# Patient Record
Sex: Male | Born: 1935 | Race: White | Hispanic: No | Marital: Married | Smoking: Never smoker
Health system: Southern US, Community
[De-identification: ages and names within clinical notes are randomized; demographics above are authoritative.]

## PROBLEM LIST (undated history)

## (undated) DIAGNOSIS — F419 Anxiety disorder, unspecified: Secondary | ICD-10-CM

## (undated) DIAGNOSIS — E785 Hyperlipidemia, unspecified: Secondary | ICD-10-CM

## (undated) DIAGNOSIS — K219 Gastro-esophageal reflux disease without esophagitis: Secondary | ICD-10-CM

## (undated) HISTORY — PX: COLONOSCOPY: SHX174

## (undated) HISTORY — PX: OTHER SURGICAL HISTORY: SHX169

---

## 2007-10-19 ENCOUNTER — Emergency Department (HOSPITAL_BASED_OUTPATIENT_CLINIC_OR_DEPARTMENT_OTHER): Admission: EM | Admit: 2007-10-19 | Discharge: 2007-10-19 | Payer: Self-pay | Admitting: Emergency Medicine

## 2013-02-24 ENCOUNTER — Emergency Department (HOSPITAL_BASED_OUTPATIENT_CLINIC_OR_DEPARTMENT_OTHER): Payer: Medicare Other

## 2013-02-24 ENCOUNTER — Encounter (HOSPITAL_BASED_OUTPATIENT_CLINIC_OR_DEPARTMENT_OTHER): Payer: Self-pay | Admitting: Emergency Medicine

## 2013-02-24 ENCOUNTER — Emergency Department (HOSPITAL_BASED_OUTPATIENT_CLINIC_OR_DEPARTMENT_OTHER)
Admission: EM | Admit: 2013-02-24 | Discharge: 2013-02-25 | Disposition: A | Discharge status: Home / Self Care | Payer: Medicare Other | Attending: Emergency Medicine | Admitting: Emergency Medicine

## 2013-02-24 DIAGNOSIS — K219 Gastro-esophageal reflux disease without esophagitis: Secondary | ICD-10-CM | POA: Insufficient documentation

## 2013-02-24 DIAGNOSIS — F411 Generalized anxiety disorder: Secondary | ICD-10-CM | POA: Diagnosis not present

## 2013-02-24 DIAGNOSIS — K529 Noninfective gastroenteritis and colitis, unspecified: Secondary | ICD-10-CM

## 2013-02-24 DIAGNOSIS — R5381 Other malaise: Secondary | ICD-10-CM | POA: Insufficient documentation

## 2013-02-24 DIAGNOSIS — Z79899 Other long term (current) drug therapy: Secondary | ICD-10-CM | POA: Insufficient documentation

## 2013-02-24 DIAGNOSIS — R1084 Generalized abdominal pain: Secondary | ICD-10-CM | POA: Diagnosis present

## 2013-02-24 DIAGNOSIS — K5289 Other specified noninfective gastroenteritis and colitis: Secondary | ICD-10-CM | POA: Diagnosis not present

## 2013-02-24 DIAGNOSIS — E785 Hyperlipidemia, unspecified: Secondary | ICD-10-CM | POA: Insufficient documentation

## 2013-02-24 DIAGNOSIS — R911 Solitary pulmonary nodule: Secondary | ICD-10-CM

## 2013-02-24 DIAGNOSIS — R509 Fever, unspecified: Secondary | ICD-10-CM | POA: Insufficient documentation

## 2013-02-24 DIAGNOSIS — Z792 Long term (current) use of antibiotics: Secondary | ICD-10-CM | POA: Insufficient documentation

## 2013-02-24 HISTORY — DX: Hyperlipidemia, unspecified: E78.5

## 2013-02-24 HISTORY — DX: Gastro-esophageal reflux disease without esophagitis: K21.9

## 2013-02-24 HISTORY — DX: Anxiety disorder, unspecified: F41.9

## 2013-02-24 LAB — CBC WITH DIFFERENTIAL/PLATELET
Basophils Absolute: 0 K/uL (ref 0.0–0.1)
Basophils Relative: 0 % (ref 0–1)
Eosinophils Absolute: 0.4 10*3/uL (ref 0.0–0.7)
Eosinophils Relative: 3 % (ref 0–5)
HCT: 44.2 % (ref 39.0–52.0)
Hemoglobin: 14.9 g/dL (ref 13.0–17.0)
Lymphocytes Relative: 26 % (ref 12–46)
Lymphs Abs: 3.8 10*3/uL (ref 0.7–4.0)
MCH: 30.8 pg (ref 26.0–34.0)
MCHC: 33.7 g/dL (ref 30.0–36.0)
MCV: 91.3 fL (ref 78.0–100.0)
Monocytes Absolute: 1.5 10*3/uL — ABNORMAL HIGH (ref 0.1–1.0)
Monocytes Relative: 10 % (ref 3–12)
Neutro Abs: 9 10*3/uL — ABNORMAL HIGH (ref 1.7–7.7)
Neutrophils Relative %: 61 % (ref 43–77)
Platelets: 259 K/uL (ref 150–400)
RBC: 4.84 MIL/uL (ref 4.22–5.81)
RDW: 14.1 % (ref 11.5–15.5)
WBC: 14.7 10*3/uL — ABNORMAL HIGH (ref 4.0–10.5)

## 2013-02-24 LAB — CG4 I-STAT (LACTIC ACID): Lactic Acid, Venous: 1.5 mmol/L (ref 0.5–2.2)

## 2013-02-24 LAB — OCCULT BLOOD X 1 CARD TO LAB, STOOL: Fecal Occult Bld: POSITIVE — AB

## 2013-02-24 MED ORDER — FENTANYL CITRATE 0.05 MG/ML IJ SOLN
50.0000 ug | Freq: Once | INTRAMUSCULAR | Status: AC
  Administered 2013-02-25: 50 ug via INTRAVENOUS
  Filled 2013-02-24: qty 2

## 2013-02-24 MED ORDER — SODIUM CHLORIDE 0.9 % IV SOLN
Freq: Once | INTRAVENOUS | Status: AC
  Administered 2013-02-24: via INTRAVENOUS

## 2013-02-24 MED ORDER — ONDANSETRON HCL 4 MG/2ML IJ SOLN
4.0000 mg | Freq: Once | INTRAMUSCULAR | Status: AC
  Administered 2013-02-25: 4 mg via INTRAVENOUS
  Filled 2013-02-24: qty 2

## 2013-02-24 MED ORDER — PANTOPRAZOLE SODIUM 40 MG IV SOLR
40.0000 mg | Freq: Once | INTRAVENOUS | Status: AC
  Administered 2013-02-25: 40 mg via INTRAVENOUS
  Filled 2013-02-24: qty 40

## 2013-02-24 NOTE — ED Provider Notes (Signed)
CSN: [id number]     Arrival date & time 02/24/13  2010 History  This chart was scribed for Dean Ruiz. Oletta Lamas, MD by Danella Maiers, ED Scribe. This patient was seen in room MH05/MH05 and the patient's care was started at 10:17 PM.   Chief Complaint  Patient presents with  . Abdominal Pain   The history is provided by the patient. No language interpreter was used.   HPI Comments: Dean Ruiz is a 77 y.o. male who presents to the Emergency Department complaining of intermittent diarrhea and abdominal pain since having a colonoscopy 01/09/13 that has become worse and more frequent in the last week with fevers. It was his third colonoscopy by [facility] in [facility] They said he had inflammation of the colon and put him on lialda. He started running a fever and had watery diarrhea, so he went back to the doctor and they switched him to Cipro and Flagyl. He continued having diarrhea and they switched his medications multiple times without relief so last week they took him off all medications and put him on a probiotic. He went again yesterday and they put him on cipro . He denies anything abnormal in the first two colonoscopies. He denies sore throat, cough, rhinorrhea. He has diffuse abd pain, feels bloated all the time, very weak and fatigued.  He had chills today and had tylenol this afternoon.   Past Medical History  Diagnosis Date  . Acid reflux   . Hyperlipemia   . Anxiety    Past Surgical History  Procedure Laterality Date  . Colonoscopy     History reviewed. No pertinent family history. History  Substance Use Topics  . Smoking status: Never Smoker   . Smokeless tobacco: Not on file  . Alcohol Use: No    Review of Systems  Constitutional: Positive for fever, chills and fatigue.  HENT: Negative for rhinorrhea and sinus pressure.   Respiratory: Negative for cough.   Gastrointestinal: Positive for abdominal pain, diarrhea and abdominal distention. Negative for nausea and  vomiting.  Skin: Negative for rash.  Neurological: Positive for weakness.  All other systems reviewed and are negative.    Allergies  Review of patient's allergies indicates no known allergies.  Home Medications   Current Outpatient Rx  Name  Route  Sig  Dispense  Refill  . ciprofloxacin (CIPRO) 500 MG tablet   Oral   Take 500 mg by mouth 2 (two) times daily.         Marland Kitchen LORazepam (ATIVAN) 1 MG tablet   Oral   Take 1 mg by mouth every 8 (eight) hours.         . Multiple Vitamins-Minerals (MULTIVITAMIN WITH MINERALS) tablet   Oral   Take 1 tablet by mouth daily.         . RABEprazole (ACIPHEX) 20 MG tablet   Oral   Take 20 mg by mouth daily.         . rosuvastatin (CRESTOR) 10 MG tablet   Oral   Take 10 mg by mouth daily.          BP 144/84  Pulse 98  Temp(Src) 99.5 F (37.5 C) (Oral)  Resp 20  Ht 5\' 10"  (1.778 m)  Wt 194 lb (87.998 kg)  BMI 27.84 kg/m2  SpO2 98% Physical Exam  Nursing note and vitals reviewed. Constitutional: He is oriented to person, place, and time. He appears well-developed and well-nourished. No distress.  HENT:  Head: Normocephalic and atraumatic.  Eyes: EOM are normal.  Neck: Neck supple. No tracheal deviation present.  Cardiovascular: Normal rate, regular rhythm and intact distal pulses.   Pulmonary/Chest: Effort normal. No respiratory distress.  Abdominal: Soft. Bowel sounds are increased. There is generalized tenderness. There is no rebound.  Hyperactive abdominal sounds. Mild tenderness diffusely.  Musculoskeletal: Normal range of motion.  Neurological: He is alert and oriented to person, place, and time. He exhibits normal muscle tone. Coordination and gait normal.  Skin: Skin is warm and dry. No rash noted. He is not diaphoretic.  Psychiatric: He has a normal mood and affect. His behavior is normal.    ED Course  Procedures (including critical care time) Medications  0.9 %  sodium chloride infusion (not administered)     DIAGNOSTIC STUDIES: Oxygen Saturation is 98% on RA, normal by my interpretation.    COORDINATION OF CARE: 10:29 PM- Discussed treatment plan with pt which includes CT abdomen, blood work, stool sample, UA. Pt agrees to plan.    Labs Review Labs Reviewed  CBC WITH DIFFERENTIAL  COMPREHENSIVE METABOLIC PANEL  LIPASE, BLOOD  URINALYSIS, ROUTINE W REFLEX MICROSCOPIC  OCCULT BLOOD X 1 CARD TO LAB, STOOL  GI PATHOGEN PANEL BY PCR, STOOL   Imaging Review No results found.  EKG Interpretation   None       MDM   1. Suspected Colitis      I personally performed the services described in this documentation, which was scribed in my presence. The recorded information has been reviewed and considered.  Pt with subacute diarrhea, fevers without unusual travel history.  No reported blood, just very waterry.  Pt has been tried on abx, probiotics now, and lialda initially for colitis.  Pt is distended with diffuse tenderness, likely low grade fevers.  C diff is possible, will send for GI pathogens.  Will get CT scan.  IVF's, IV analgesics and zofran prn.  Pt is not septic appearing at this time.  No h/o atrial fib.  No CP, back pain.  PCP is with [facility].  Will sign out to Dr. Nicanor Alcon to follow up on labs, CT scan.  Would recommend stopping Cipro and consider treatment with just Flagyl.    Dean Ruiz. Houda Brau, MD 02/25/13 1513

## 2013-02-24 NOTE — ED Notes (Signed)
Pt reports colonoscopy Sept 9 and has had intermittent diarrhea and abd pain since then pt in ED tonight because diarrhea has not stopped for the past one week and has seen by PMD 5 time since Sept 9th.

## 2013-02-25 DIAGNOSIS — K5289 Other specified noninfective gastroenteritis and colitis: Secondary | ICD-10-CM | POA: Diagnosis not present

## 2013-02-25 LAB — URINALYSIS, ROUTINE W REFLEX MICROSCOPIC
Bilirubin Urine: NEGATIVE
Glucose, UA: NEGATIVE mg/dL
Hgb urine dipstick: NEGATIVE
Ketones, ur: NEGATIVE mg/dL
Leukocytes, UA: NEGATIVE
Nitrite: NEGATIVE
Protein, ur: NEGATIVE mg/dL
Specific Gravity, Urine: 1.005 (ref 1.005–1.030)
Urobilinogen, UA: 0.2 mg/dL (ref 0.0–1.0)
pH: 6 (ref 5.0–8.0)

## 2013-02-25 LAB — COMPREHENSIVE METABOLIC PANEL WITH GFR
ALT: 14 U/L (ref 0–53)
CO2: 29 meq/L (ref 19–32)
Chloride: 99 meq/L (ref 96–112)
GFR calc non Af Amer: 81 mL/min — ABNORMAL LOW (ref >90)
Sodium: 139 meq/L (ref 135–145)
Total Bilirubin: 0.6 mg/dL (ref 0.3–1.2)

## 2013-02-25 LAB — COMPREHENSIVE METABOLIC PANEL
AST: 9 U/L (ref 0–37)
Albumin: 4.2 g/dL (ref 3.5–5.2)
Alkaline Phosphatase: 64 U/L (ref 39–117)
BUN: 10 mg/dL (ref 6–23)
Calcium: 10.4 mg/dL (ref 8.4–10.5)
Creatinine, Ser: 0.9 mg/dL (ref 0.50–1.35)
GFR calc Af Amer: >90 mL/min (ref >90)
Glucose, Bld: 106 mg/dL — ABNORMAL HIGH (ref 70–99)
Potassium: 3.8 mEq/L (ref 3.5–5.1)
Total Protein: 8.5 g/dL — ABNORMAL HIGH (ref 6.0–8.3)

## 2013-02-25 LAB — LIPASE, BLOOD: Lipase: 18 U/L (ref 11–59)

## 2013-02-25 MED ORDER — CULTURELLE DIGESTIVE HEALTH PO CAPS: 1.0000 | ORAL_CAPSULE | Freq: Three times a day (TID) | ORAL | Status: AC

## 2013-02-25 MED ORDER — IOHEXOL 300 MG/ML  SOLN
50.0000 mL | Freq: Once | INTRAMUSCULAR | Status: AC | PRN
  Administered 2013-02-24: 50 mL via ORAL

## 2013-02-25 MED ORDER — METRONIDAZOLE 500 MG PO TABS: 500.0000 mg | ORAL_TABLET | Freq: Three times a day (TID) | ORAL | Status: AC

## 2013-02-25 MED ORDER — IOHEXOL 300 MG/ML  SOLN
100.0000 mL | Freq: Once | INTRAMUSCULAR | Status: AC | PRN
  Administered 2013-02-25: 100 mL via INTRAVENOUS

## 2013-02-25 MED ORDER — HYDROCODONE-ACETAMINOPHEN 5-325 MG PO TABS: 1.0000 | ORAL_TABLET | Freq: Four times a day (QID) | ORAL | Status: AC | PRN

## 2013-02-25 NOTE — ED Notes (Signed)
Report given to Bobby, RN.  

## 2013-02-25 NOTE — Discharge Instructions (Signed)
Colitis °Colitis is inflammation of the colon. Colitis can be a short-term or long-standing (chronic) illness. Crohn's disease and ulcerative colitis are 2 types of colitis which are chronic. They usually require lifelong treatment. °CAUSES  °There are many different causes of colitis, including: °· Viruses. °· Germs (bacteria). °· Medicine reactions. °SYMPTOMS  °· Diarrhea. °· Intestinal bleeding. °· Pain. °· Fever. °· Throwing up (vomiting). °· Tiredness (fatigue). °· Weight loss. °· Bowel blockage. °DIAGNOSIS  °The diagnosis of colitis is based on examination and stool or blood tests. X-rays, CT scan, and colonoscopy may also be needed. °TREATMENT  °Treatment may include: °· Fluids given through the vein (intravenously). °· Bowel rest (nothing to eat or drink for a period of time). °· Medicine for pain and diarrhea. °· Medicines (antibiotics) that kill germs. °· Cortisone medicines. °· Surgery. °HOME CARE INSTRUCTIONS  °· Get plenty of rest. °· Drink enough water and fluids to keep your urine clear or pale yellow. °· Eat a well-balanced diet. °· Call your caregiver for follow-up as recommended. °SEEK IMMEDIATE MEDICAL CARE IF:  °· You develop chills. °· You have an oral temperature above 102° F (38.9° C), not controlled by medicine. °· You have extreme weakness, fainting, or dehydration. °· You have repeated vomiting. °· You develop severe belly (abdominal) pain or are passing bloody or tarry stools. °MAKE SURE YOU:  °· Understand these instructions. °· Will watch your condition. °· Will get help right away if you are not doing well or get worse. °Document Released: 05/27/2004 Document Revised: 07/12/2011 Document Reviewed: 08/22/2009 °ExitCare® Patient Information ©2014 [employer]. ° °

## 2013-02-25 NOTE — ED Notes (Signed)
Pt aware that we need a urine specimen.  Urinal at bedside.

## 2013-02-26 LAB — GI PATHOGEN PANEL BY PCR, STOOL
C difficile toxin A/B: POSITIVE
Campylobacter by PCR: NEGATIVE
Cryptosporidium by PCR: NEGATIVE
E coli (ETEC) LT/ST: NEGATIVE
E coli (STEC): NEGATIVE
E coli 0157 by PCR: NEGATIVE
G lamblia by PCR: NEGATIVE
Norovirus GI/GII: NEGATIVE
Rotavirus A by PCR: NEGATIVE
Salmonella by PCR: NEGATIVE
Shigella by PCR: NEGATIVE

## 2015-01-09 IMAGING — CT CT ABD-PELV W/ CM
2 of 5 series · 15 of 46 positions shown, 17 images · IV contrast (omnipaque)
Comparison: None.

CLINICAL DATA: Diarrhea and abdominal pain and.  Fever.

EXAM:
CT ABDOMEN AND PELVIS WITH CONTRAST
TECHNIQUE: Multidetector CT imaging of the abdomen and pelvis was performed
using the standard protocol following bolus administration of
intravenous contrast.
CONTRAST:  50mL OMNIPAQUE IOHEXOL 300 MG/ML SOLN, 100mL OMNIPAQUE
IOHEXOL 300 MG/ML SOLN

[Series 2: abd/pelvis 5.0 b31f · axial · 0.85mm/px · z∈[+595,+1040]mm · 12 of 101 slices shown, 14 images]
[im 6/101  soft-tissue]
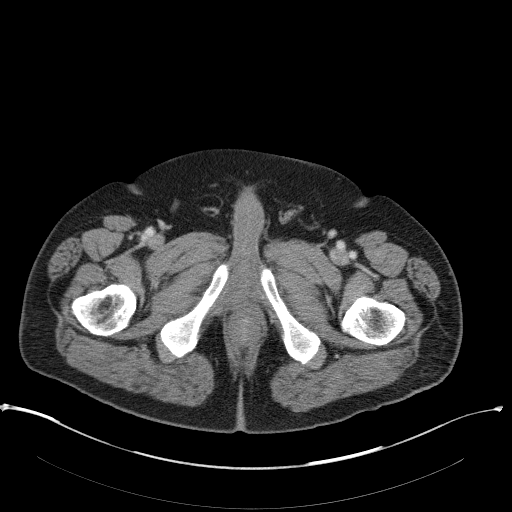
[im 6/101  bone]
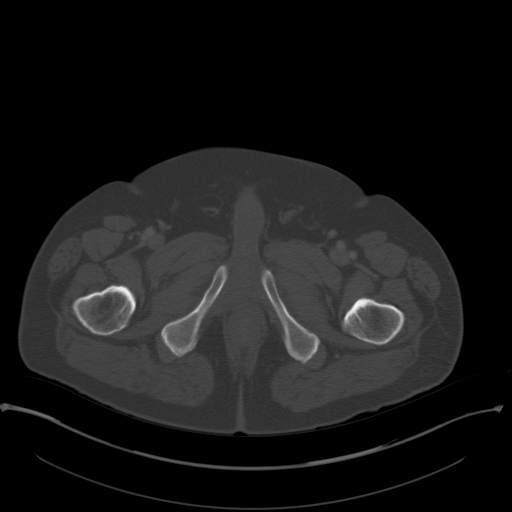
[im 16/101  soft-tissue]
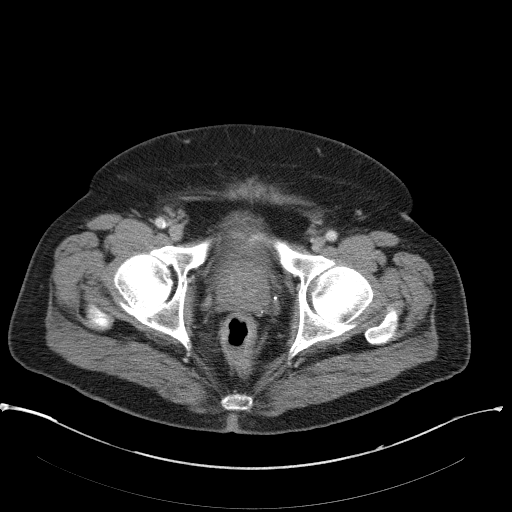
[im 22/101  soft-tissue]
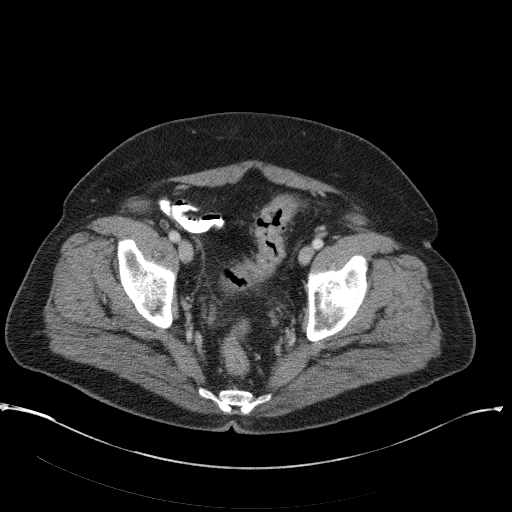
[im 32/101  soft-tissue]
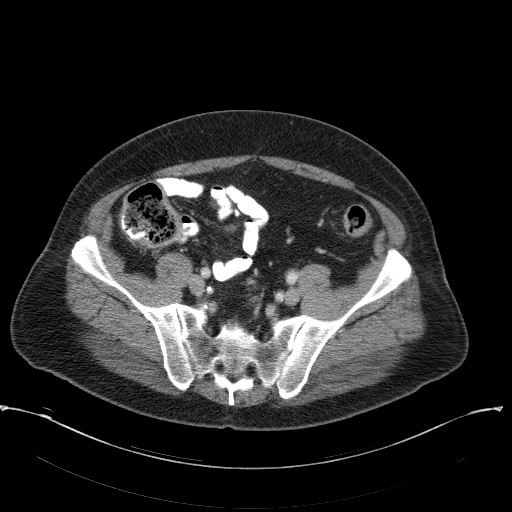
[im 37/101  soft-tissue]
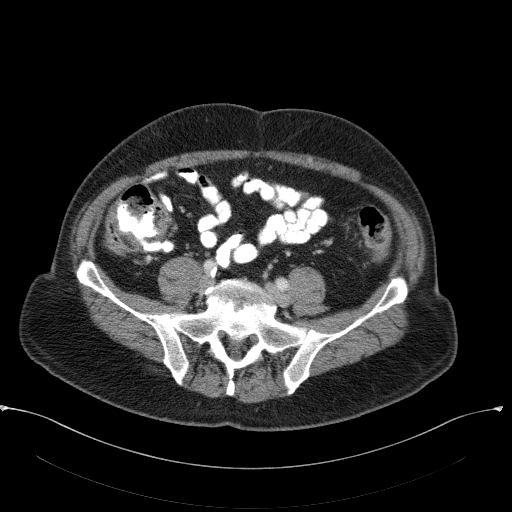
[im 48/101  soft-tissue]
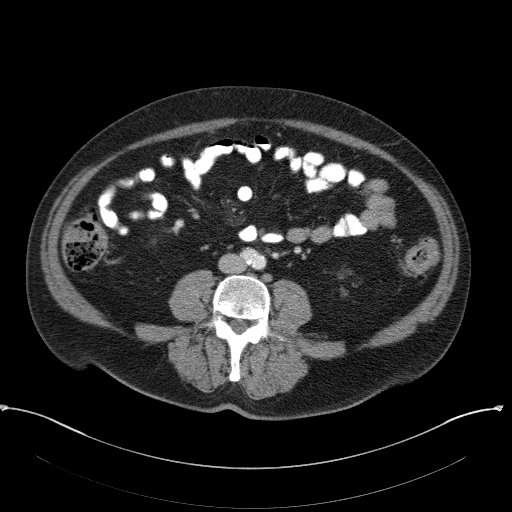
[im 53/101  soft-tissue]
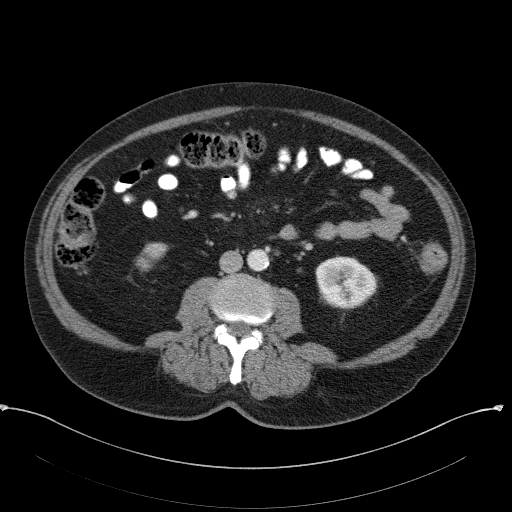
[im 64/101  soft-tissue]
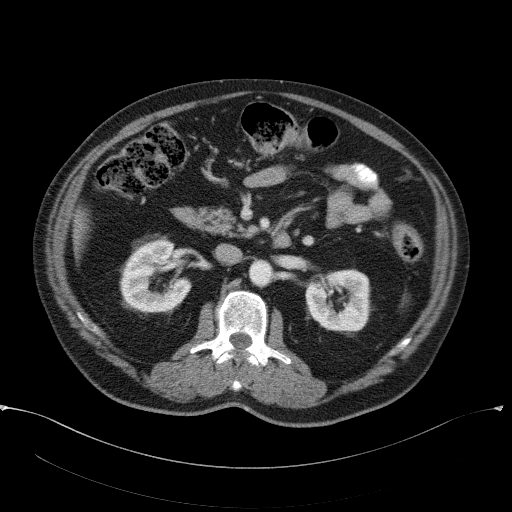
[im 69/101  soft-tissue]
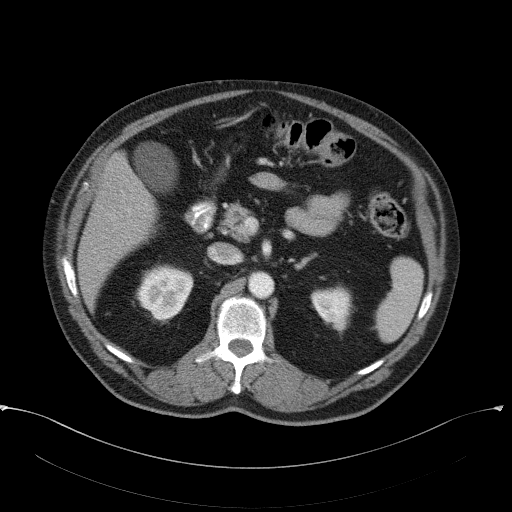
[im 69/101  bone]
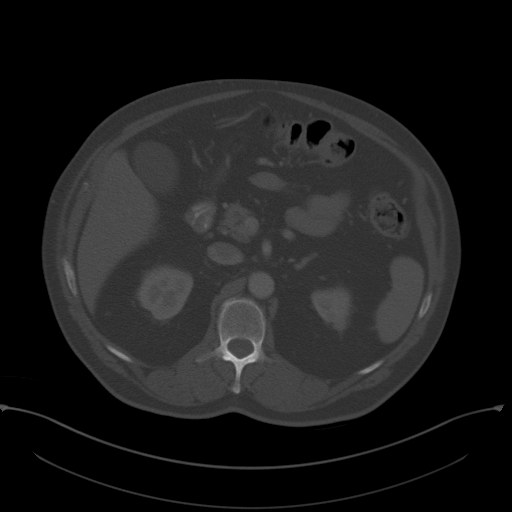
[im 79/101  soft-tissue]
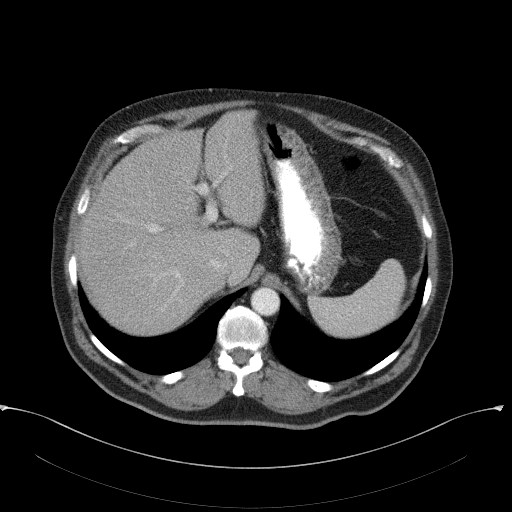
[im 85/101  soft-tissue]
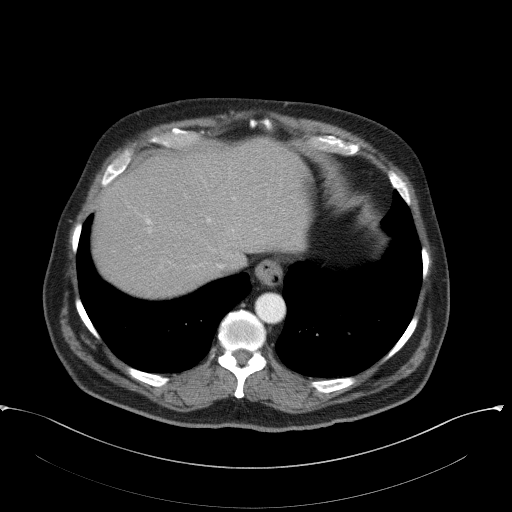
[im 95/101  soft-tissue]
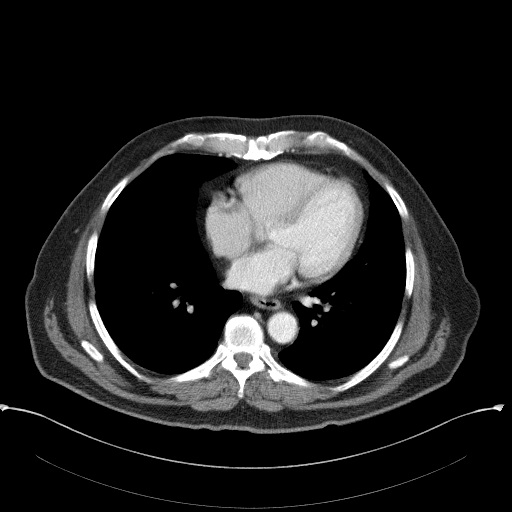

[Series 5: abd/pelvis 3.0 coronal · coronal · 0.95mm/px · 3 of 92 slices shown]
[im 31/92  soft-tissue]
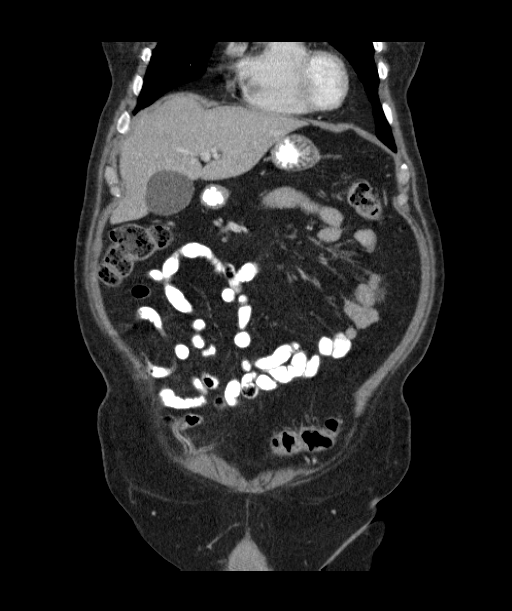
[im 41/92  soft-tissue]
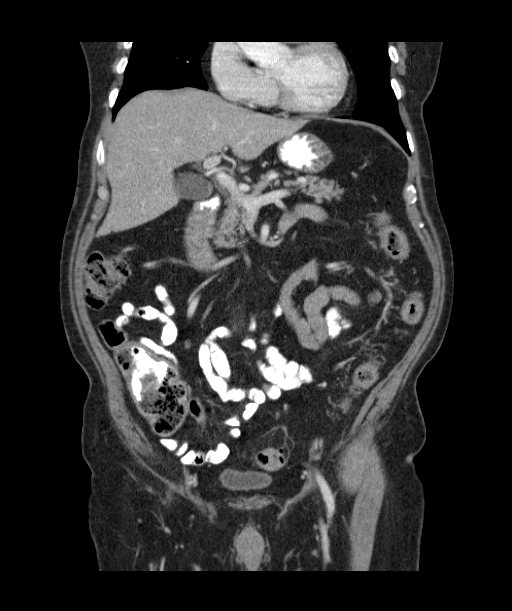
[im 51/92  soft-tissue]
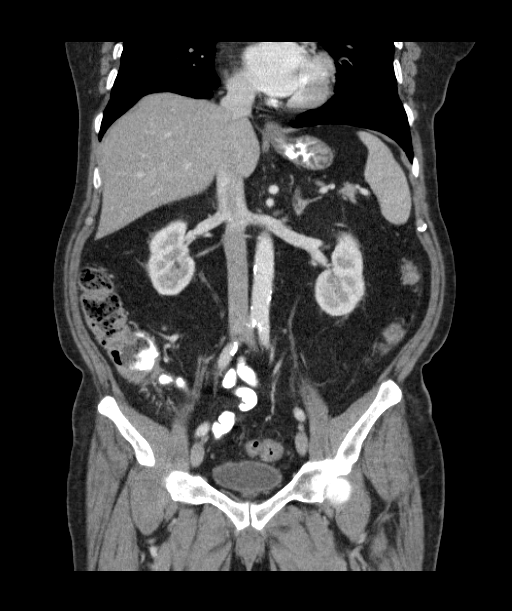

[15 of 46 positions shown; findings below may reference images not displayed]

FINDINGS: Lung Bases: Bilateral lower lobe 4 mm pulmonary nodules are per
(image 4 ,image 10)

Liver:  Normal.

Spleen:  Normal.

Gallbladder:  Distended. No calcified stones.

Common bile duct:  Normal.

Pancreas:  Normal.

Adrenal glands:  Normal bilaterally.

Kidneys: Normal enhancement. Nonspecific bilateral perinephric
stranding is probably just age related. Normal excretion of contrast
from both kidneys. 1 cm simple renal cyst Is present in the left
kidney. The left ureter appears normal. Right ureter appears normal.

Stomach:  Small hiatal hernia.  Otherwise normal.

Small bowel: No inflammatory changes of small bowel or obstruction.
This D mesentery is present, nonspecific. Ileocecal valve appears
normal.

Colon: There are no right lower quadrant inflammatory changes. The
appendix is not identified. Ascending colon and transverse colon
demonstrate prominent stool. The descending colon and sigmoid
coiling show mild mural thickening which is probably due to
underdistention. Similar changes are present within the rectum
although there is some fluid in the rectum suggesting proctitis. The
sigmoid colon demonstrates prominent fat around the sigmoid
suggesting creeping fat which can be associated with inflammatory
bowel disease.

Pelvic Genitourinary: Normal appearance of the urinary bladder.
Prostate enlargement to 4.9 cm transverse.

Bones:  No aggressive osseous lesions. Lumbar spondylosis.

Vasculature: Atherosclerosis. No aneurysm or acute vascular
abnormality.

Body Wall: Fat containing right inguinal hernia.
IMPRESSION: 1. 4 mm and smaller peripheral pulmonary nodules in the lower lobes.
If the patient is at high risk for bronchogenic carcinoma, follow-up
chest CT at 9year is recommended. If the patient is at low risk, no
follow-up is needed. This recommendation follows the consensus
statement: Guidelines for Management of Small Pulmonary Nodules
Detected on CT Scans: A Statement from the [HOSPITAL] as
published in Radiology 0880; [DATE].
2. Small amount of fluid in the rectum is abnormal but nonspecific
and can be associated with proctitis. Suggestion of creeping fat
along the sigmoid can be associated with inflammatory bowel disease.
Descending colon and sigmoid colon or decompressed which may account
for mural thickening. Mild colitis can have a similar appearance.

## 2019-10-07 ENCOUNTER — Encounter (HOSPITAL_BASED_OUTPATIENT_CLINIC_OR_DEPARTMENT_OTHER): Payer: Self-pay | Admitting: Emergency Medicine

## 2019-10-07 ENCOUNTER — Emergency Department (HOSPITAL_BASED_OUTPATIENT_CLINIC_OR_DEPARTMENT_OTHER): Payer: Medicare Other

## 2019-10-07 ENCOUNTER — Other Ambulatory Visit: Payer: Self-pay

## 2019-10-07 ENCOUNTER — Emergency Department (HOSPITAL_BASED_OUTPATIENT_CLINIC_OR_DEPARTMENT_OTHER)
Admission: EM | Admit: 2019-10-07 | Discharge: 2019-10-07 | Disposition: A | Discharge status: Home / Self Care | Payer: Medicare Other | Attending: Emergency Medicine | Admitting: Emergency Medicine

## 2019-10-07 DIAGNOSIS — R300 Dysuria: Secondary | ICD-10-CM | POA: Diagnosis not present

## 2019-10-07 DIAGNOSIS — Z96651 Presence of right artificial knee joint: Secondary | ICD-10-CM | POA: Insufficient documentation

## 2019-10-07 DIAGNOSIS — K5900 Constipation, unspecified: Secondary | ICD-10-CM | POA: Diagnosis not present

## 2019-10-07 DIAGNOSIS — Z79899 Other long term (current) drug therapy: Secondary | ICD-10-CM | POA: Diagnosis not present

## 2019-10-07 DIAGNOSIS — R1030 Lower abdominal pain, unspecified: Secondary | ICD-10-CM | POA: Diagnosis present

## 2019-10-07 LAB — COMPREHENSIVE METABOLIC PANEL
ALT: 30 U/L (ref 0–44)
AST: 15 U/L (ref 15–41)
Albumin: 3.4 g/dL — ABNORMAL LOW (ref 3.5–5.0)
Alkaline Phosphatase: 49 U/L (ref 38–126)
Anion gap: 10 (ref 5–15)
BUN: 19 mg/dL (ref 8–23)
CO2: 24 mmol/L (ref 22–32)
Calcium: 8.8 mg/dL — ABNORMAL LOW (ref 8.9–10.3)
Chloride: 99 mmol/L (ref 98–111)
Creatinine, Ser: 0.83 mg/dL (ref 0.61–1.24)
GFR calc Af Amer: >60 mL/min (ref >60)
GFR calc non Af Amer: >60 mL/min (ref >60)
Glucose, Bld: 122 mg/dL — ABNORMAL HIGH (ref 70–99)
Potassium: 3.6 mmol/L (ref 3.5–5.1)
Sodium: 133 mmol/L — ABNORMAL LOW (ref 135–145)
Total Bilirubin: 1 mg/dL (ref 0.3–1.2)
Total Protein: 7.5 g/dL (ref 6.5–8.1)

## 2019-10-07 LAB — CBC WITH DIFFERENTIAL/PLATELET
Abs Immature Granulocytes: 0.1 10*3/uL — ABNORMAL HIGH (ref 0.00–0.07)
Basophils Absolute: 0.1 10*3/uL (ref 0.0–0.1)
Basophils Relative: 0 %
Eosinophils Absolute: 0.1 10*3/uL (ref 0.0–0.5)
Eosinophils Relative: 0 %
HCT: 38.2 % — ABNORMAL LOW (ref 39.0–52.0)
Hemoglobin: 12.7 g/dL — ABNORMAL LOW (ref 13.0–17.0)
Immature Granulocytes: 1 %
Lymphocytes Relative: 7 %
Lymphs Abs: 1.4 10*3/uL (ref 0.7–4.0)
MCH: 31 pg (ref 26.0–34.0)
MCHC: 33.2 g/dL (ref 30.0–36.0)
MCV: 93.2 fL (ref 80.0–100.0)
Monocytes Absolute: 2.1 10*3/uL — ABNORMAL HIGH (ref 0.1–1.0)
Monocytes Relative: 12 %
Neutro Abs: 14.6 10*3/uL — ABNORMAL HIGH (ref 1.7–7.7)
Neutrophils Relative %: 80 %
Platelets: 377 10*3/uL (ref 150–400)
RBC: 4.1 MIL/uL — ABNORMAL LOW (ref 4.22–5.81)
RDW: 13.4 % (ref 11.5–15.5)
WBC: 18.3 10*3/uL — ABNORMAL HIGH (ref 4.0–10.5)
nRBC: 0 % (ref 0.0–0.2)

## 2019-10-07 LAB — URINALYSIS, ROUTINE W REFLEX MICROSCOPIC
Bilirubin Urine: NEGATIVE
Glucose, UA: NEGATIVE mg/dL
Ketones, ur: 15 mg/dL — AB
Leukocytes,Ua: NEGATIVE
Nitrite: NEGATIVE
Protein, ur: NEGATIVE mg/dL
Specific Gravity, Urine: 1.01 (ref 1.005–1.030)
pH: 6 (ref 5.0–8.0)

## 2019-10-07 LAB — URINALYSIS, MICROSCOPIC (REFLEX): Squamous Epithelial / HPF: NONE SEEN (ref 0–5)

## 2019-10-07 MED ORDER — CEPHALEXIN 500 MG PO CAPS
500.0000 mg | ORAL_CAPSULE | Freq: Two times a day (BID) | ORAL | 0 refills | Status: AC
Start: 2019-10-07 — End: ?

## 2019-10-07 MED ORDER — FLEET ENEMA 7-19 GM/118ML RE ENEM
1.0000 | ENEMA | Freq: Once | RECTAL | Status: AC
  Administered 2019-10-07: 1 via RECTAL
  Filled 2019-10-07: qty 1

## 2019-10-07 MED ORDER — CEPHALEXIN 250 MG PO CAPS
500.0000 mg | ORAL_CAPSULE | Freq: Once | ORAL | Status: AC
  Administered 2019-10-07: 500 mg via ORAL
  Filled 2019-10-07: qty 2

## 2019-10-07 MED ORDER — IOHEXOL 300 MG/ML  SOLN
100.0000 mL | Freq: Once | INTRAMUSCULAR | Status: AC | PRN
  Administered 2019-10-07: 100 mL via INTRAVENOUS

## 2019-10-07 MED ORDER — SODIUM CHLORIDE 0.9 % IV BOLUS
500.0000 mL | Freq: Once | INTRAVENOUS | Status: AC
  Administered 2019-10-07: 500 mL via INTRAVENOUS

## 2019-10-07 NOTE — Discharge Instructions (Signed)
Start taking MiraLAX for constipation.  Try to start weaning off the pain medicine.  Return to the emergency room if you have worsening symptoms including abdominal pain, vomiting, fevers, difficulty urinating, increased redness or swelling to your knee, or other worsening symptoms.

## 2019-10-07 NOTE — ED Notes (Signed)
3 scans on bladder done showed , , .

## 2019-10-07 NOTE — ED Provider Notes (Signed)
[facility] EMERGENCY DEPARTMENT Provider Note   CSN: [id number] Arrival date & time: 10/07/19  1643     History Chief Complaint  Patient presents with  . Abdominal Pain  . Constipation    Dean Ruiz is a 84 y.o. male.  Patient is a 84 year old male who presents with abdominal pain and constipation.  He had a recent right total knee replacement.  This was done on May 27.  He was discharged on May 28.  He says since that time he is only had 1 small bowel movement.  He has pain across his lower abdomen.  He also has difficulty getting his urine out and some pain on urination.  He denies any nausea or vomiting.  No known fevers although his wife said he was having some low-grade fevers at home.  No cough or chest congestion.  He had some mild history with constipation in the past but nothing significant.  He has been taking Percocet for pain control for his knee.  He said his knee seems to be doing well and is healing up well.  He supposed to see his orthopedist tomorrow to have his dressing taken off.        Past Medical History:  Diagnosis Date  . Acid reflux   . Anxiety   . Hyperlipemia     There are no problems to display for this patient.   Past Surgical History:  Procedure Laterality Date  . COLONOSCOPY    . right knee surgery         No family history on file.  Social History   Tobacco Use  . Smoking status: Never Smoker  Substance Use Topics  . Alcohol use: No  . Drug use: No    Home Medications Prior to Admission medications   Medication Sig Start Date End Date Taking? Authorizing Provider  omeprazole (PRILOSEC) 40 MG capsule Take 40 mg by mouth daily.   Yes [provider]  rosuvastatin (CRESTOR) 10 MG tablet Take 10 mg by mouth daily.   Yes [provider]  cephALEXin (KEFLEX) 500 MG capsule Take 1 capsule (500 mg total) by mouth 2 (two) times daily. 10/07/19   [name], MD  HYDROcodone-acetaminophen (NORCO) 5-325  MG per tablet Take 1 tablet by mouth every 6 (six) hours as needed for pain. 02/25/13   [name], April, MD  Lactobacillus-Inulin ([place]) CAPS Take 1 capsule by mouth 3 (three) times daily. 02/25/13   [name], April, MD  LORazepam (ATIVAN) 1 MG tablet Take 1 mg by mouth every 8 (eight) hours.    [provider]  metroNIDAZOLE (FLAGYL) 500 MG tablet Take 1 tablet (500 mg total) by mouth 3 (three) times daily. 02/25/13   [name], April, MD  Multiple Vitamins-Minerals (MULTIVITAMIN WITH MINERALS) tablet Take 1 tablet by mouth daily.    [provider]  RABEprazole (ACIPHEX) 20 MG tablet Take 20 mg by mouth daily.    [provider]    Allergies    Patient has no known allergies.  Review of Systems   Review of Systems  Constitutional: Negative for chills, diaphoresis, fatigue and fever.  HENT: Negative for congestion, rhinorrhea and sneezing.   Eyes: Negative.   Respiratory: Negative for cough, chest tightness and shortness of breath.   Cardiovascular: Negative for chest pain and leg swelling.  Gastrointestinal: Positive for abdominal pain. Negative for blood in stool, diarrhea, nausea and vomiting.  Genitourinary: Positive for difficulty urinating and dysuria. Negative for flank pain,  frequency and hematuria.  Musculoskeletal: Negative for arthralgias and back pain.  Skin: Negative for rash.  Neurological: Negative for dizziness, speech difficulty, weakness, numbness and headaches.    Physical Exam Updated Vital Signs BP (!) 144/62   Pulse 97   Temp 98.4 F (36.9 C) (Oral)   Resp 20   Ht 5\' 9"  (1.753 m)   Wt 85.3 kg   SpO2 93%   BMI 27.76 kg/m   Physical Exam Constitutional:      Appearance: He is well-developed.  HENT:     Head: Normocephalic and atraumatic.  Eyes:     Pupils: Pupils are equal, round, and reactive to light.  Cardiovascular:     Rate and Rhythm: Normal rate and regular rhythm.     Heart sounds: Normal heart  sounds.  Pulmonary:     Effort: Pulmonary effort is normal. No respiratory distress.     Breath sounds: Normal breath sounds. No wheezing or rales.  Chest:     Chest wall: No tenderness.  Abdominal:     General: Bowel sounds are normal.     Palpations: Abdomen is soft.     Tenderness: There is abdominal tenderness in the suprapubic area. There is no guarding or rebound.  Genitourinary:    Comments: There is some stool in the rectal vault but no impaction Musculoskeletal:        General: Normal range of motion.     Cervical back: Normal range of motion and neck supple.     Comments: He has essential dressing on his anterior right knee.  There is some mild swelling that appears consistent with postoperative swelling and some mild warmth and erythema.  Lymphadenopathy:     Cervical: No cervical adenopathy.  Skin:    General: Skin is warm and dry.     Findings: No rash.  Neurological:     Mental Status: He is alert and oriented to person, place, and time.     ED Results / Procedures / Treatments   Labs (all labs ordered are listed, but only abnormal results are displayed) Labs Reviewed  COMPREHENSIVE METABOLIC PANEL - Abnormal; Notable for the following components:      Result Value   Sodium 133 (*)    Glucose, Bld 122 (*)    Calcium 8.8 (*)    Albumin 3.4 (*)    All other components within normal limits  CBC WITH DIFFERENTIAL/PLATELET - Abnormal; Notable for the following components:   WBC 18.3 (*)    RBC 4.10 (*)    Hemoglobin 12.7 (*)    HCT 38.2 (*)    Neutro Abs 14.6 (*)    Monocytes Absolute 2.1 (*)    Abs Immature Granulocytes 0.10 (*)    All other components within normal limits  URINALYSIS, ROUTINE W REFLEX MICROSCOPIC - Abnormal; Notable for the following components:   Color, Urine ORANGE (*)    Hgb urine dipstick LARGE (*)    Ketones, ur 15 (*)    All other components within normal limits  URINALYSIS, MICROSCOPIC (REFLEX) - Abnormal; Notable for the following  components:   Bacteria, UA RARE (*)    All other components within normal limits  URINE CULTURE    EKG None  Radiology CT Abdomen Pelvis W Contrast  Result Date: 10/07/2019 CLINICAL DATA:  Abdominal pain x2 days. EXAM: CT ABDOMEN AND PELVIS WITH CONTRAST TECHNIQUE: Multidetector CT imaging of the abdomen and pelvis was performed using the standard protocol following bolus administration of intravenous contrast.  CONTRAST:  OMNIPAQUE IOHEXOL 300 MG/ML  SOLN COMPARISON:  February 25, 2013 FINDINGS: Lower chest: A stable 4 mm noncalcified lung nodule is seen within the posterolateral aspect of the right lung base. Hepatobiliary: No focal liver abnormality is seen. No gallstones, gallbladder wall thickening, or biliary dilatation. Pancreas: Unremarkable. No pancreatic ductal dilatation or surrounding inflammatory changes. Spleen: Normal in size without focal abnormality. Adrenals/Urinary Tract: Adrenal glands are unremarkable. Kidneys are normal, without renal calculi or hydronephrosis. A 1.5 cm simple cyst is seen within the lateral aspect of the mid left kidney. Bladder is unremarkable. Stomach/Bowel: There is a small hiatal hernia. The appendix is not identified. No evidence of bowel dilatation. Mild thickening of the mid to distal descending colon is seen with a mild amount of pericolonic inflammatory fat stranding. Vascular/Lymphatic: There is moderate severity calcification of the abdominal aorta and bilateral common iliac arteries. No enlarged abdominal or pelvic lymph nodes. Reproductive: There is mild to moderate severity prostate gland enlargement. Other: No abdominal wall hernia or abnormality. No abdominopelvic ascites. Musculoskeletal: Multilevel degenerative changes are seen throughout the lumbar spine. IMPRESSION: 1. Mild thickening of the mid to distal descending colon with a mild amount of pericolonic inflammatory fat stranding, consistent with mild colitis. 2. Small hiatal hernia. 3.  Stable 4 mm noncalcified lung nodule within the posterolateral aspect of the right lung base. 4. Small left renal cyst. 5. Mild to moderate severity prostate gland enlargement. 6. Aortic atherosclerosis. Aortic Atherosclerosis (ICD10-I70.0). Electronically Signed   By: Aram Candela M.D.   On: 10/07/2019 19:04    Procedures Procedures (including critical care time)  Medications Ordered in ED Medications  cephALEXin (KEFLEX) capsule 500 mg (has no administration in time range)  iohexol (OMNIPAQUE) 300 MG/ML solution 100 mL (100 mLs Intravenous Contrast Given 10/07/19 1834)  sodium phosphate (FLEET) 7-19 GM/118ML enema 1 enema (1 enema Rectal Given 10/07/19 2029)  sodium chloride 0.9 % bolus 500 mL (500 mLs Intravenous New Bag/Given 10/07/19 2131)    ED Course  I have reviewed the triage vital signs and the nursing notes.  Pertinent labs & imaging results that were available during my care of the patient were reviewed by me and considered in my medical decision making (see chart for details).    MDM Rules/Calculators/A&P                      Patient is a 84 year old male who presents with abdominal pain and constipation.  He had some pain across his lower abdomen.  Given that, a CT scan was performed which shows some small area of colitis but otherwise unremarkable.  I really do not think that he has infectious colitis.  He does not have any diarrhea or other symptoms that would be more suggestive of that.  He did have some difficulty urinating.  His urine however does not look overwhelming for infection.  It was sent for culture.  His WBC count is elevated.  He has not had a fever here.  His initial temperature was document 100 however 4 minutes later it was documented 98.7.  A rectal temp was obtained which was afebrile.  His wife said he did have a temperature of 100 earlier today.  His knee shows some mild erythema.  I suspect it is more postop changes.  However given the urinary symptoms as  well as some questionable increased redness around his knee, I will start him on Keflex.  His wife says his knee looks pretty much  the same but may be a little bit of increased redness.  He has an appointment with his orthopedist on Thursday.  He had an enema which gave him good results and he had a large bowel movement after this.  He felt much better and had no further abdominal pain on exam.  He has had no vomiting or signs of obstruction.  He has been able to urinate without signs of urinary obstruction.  His urine was sent for culture.  His other labs are nonconcerning.  He was discharged home in good condition.  He was started on Keflex and advised to follow-up with his PCP and orthopedist.  Return precautions were given.  I did advise the wife to have him start taking MiraLAX and to try to wean him off his Percocet. Final Clinical Impression(s) / ED Diagnoses Final diagnoses:  Constipation, unspecified constipation type  Dysuria    Rx / DC Orders ED Discharge Orders         Ordered    cephALEXin (KEFLEX) 500 MG capsule  2 times daily     10/07/19 2232           Rolan Bucco, MD 10/07/19 2240

## 2019-10-07 NOTE — ED Triage Notes (Signed)
Brought by ems from home for c/o lower abdominal cramping, constipation, urinary incontinence.   Reports last bm a week ago.  Had right knee surgery on 5/27.  Has a had a low grade temp max 100 per spouse.

## 2019-10-08 LAB — URINE CULTURE: Culture: NO GROWTH

## 2021-08-20 IMAGING — CT CT ABD-PELV W/ CM
2 of 5 series · 16 of 46 positions shown, 18 images · IV contrast (Omnipaque)
Comparison: February 25, 2013

CLINICAL DATA: Abdominal pain x2 days.

EXAM:
CT ABDOMEN AND PELVIS WITH CONTRAST
TECHNIQUE: Multidetector CT imaging of the abdomen and pelvis was performed
using the standard protocol following bolus administration of
intravenous contrast.
CONTRAST:  100mL OMNIPAQUE IOHEXOL 300 MG/ML  SOLN

[Series 2: axial st · axial · 0.95mm/px · z∈[-526,-42]mm · 13 of 109 slices shown, 15 images]
[im 6/109  soft-tissue]
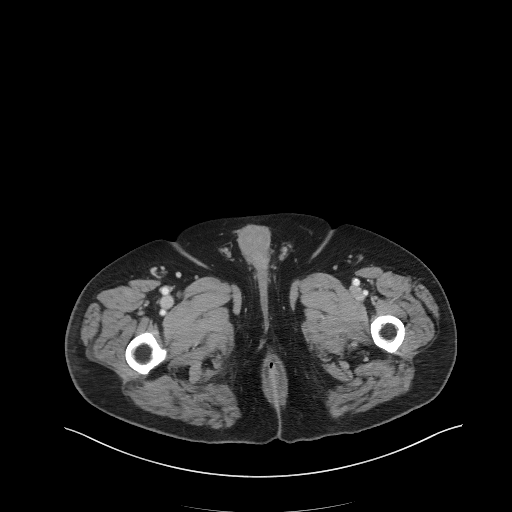
[im 6/109  bone]
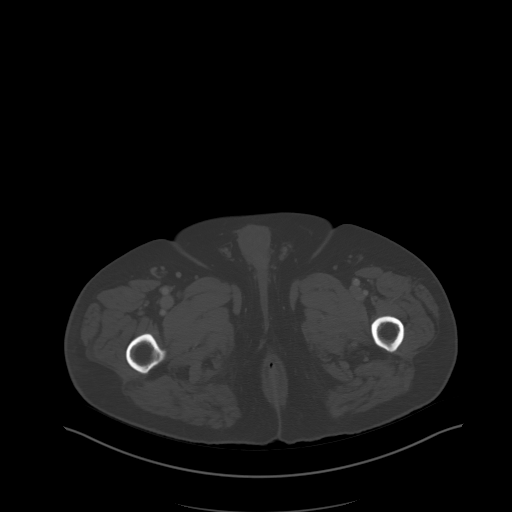
[im 18/109  soft-tissue]
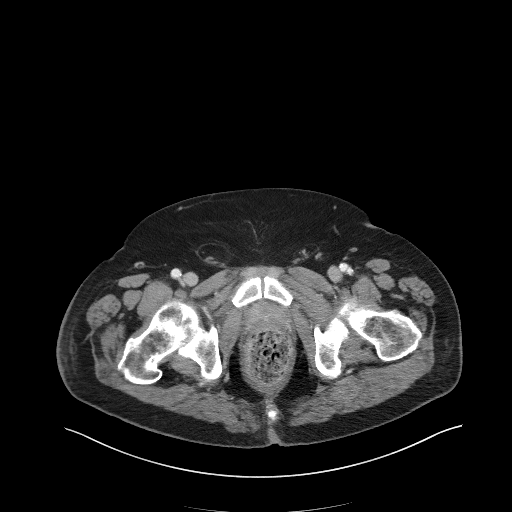
[im 23/109  soft-tissue]
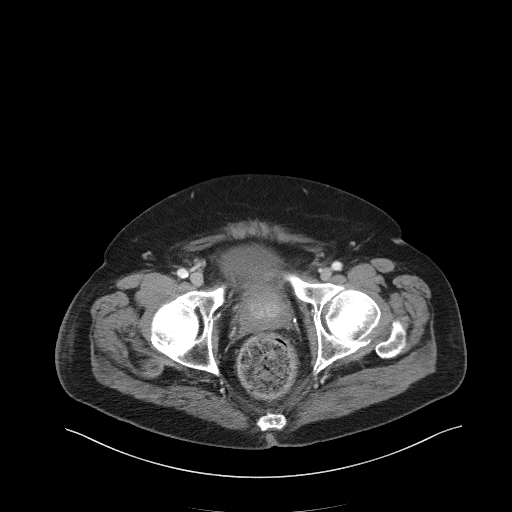
[im 29/109  soft-tissue]
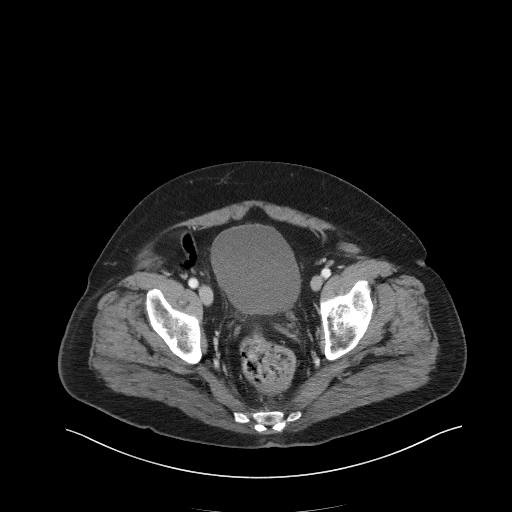
[im 40/109  soft-tissue]
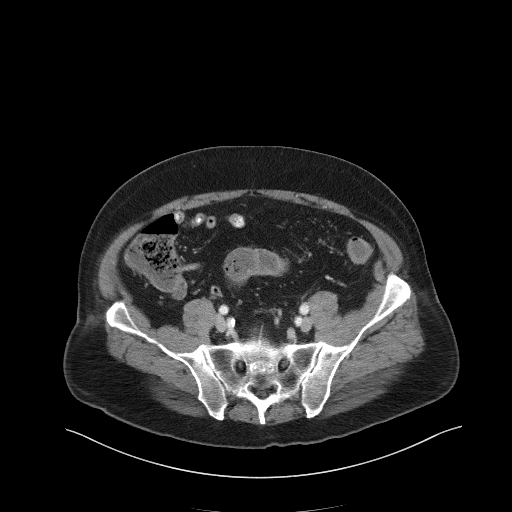
[im 46/109  soft-tissue]
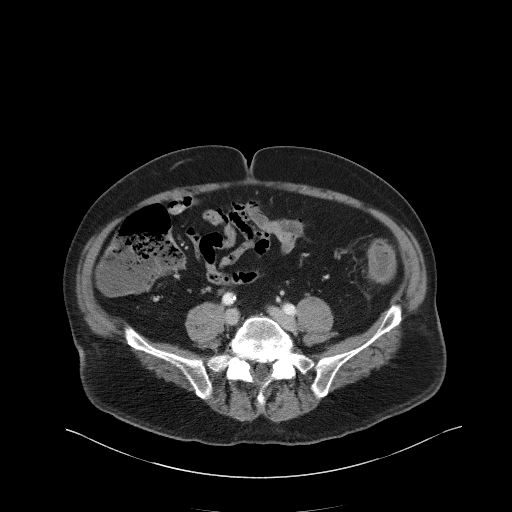
[im 57/109  soft-tissue]
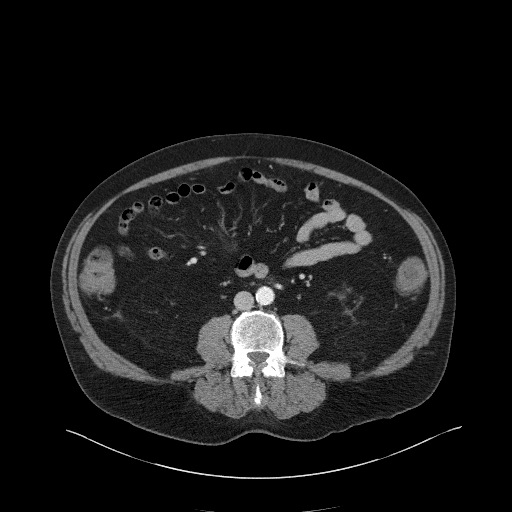
[im 63/109  soft-tissue]
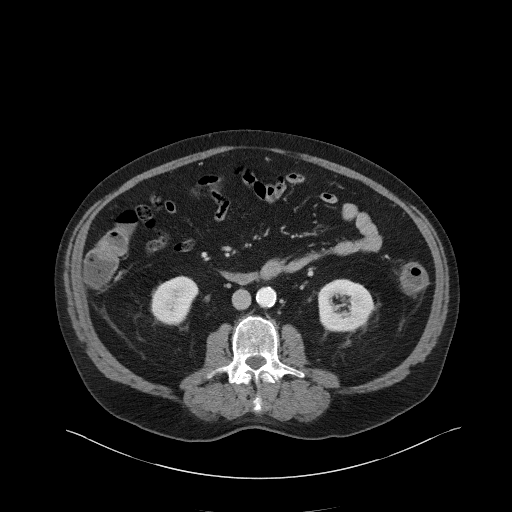
[im 69/109  soft-tissue]
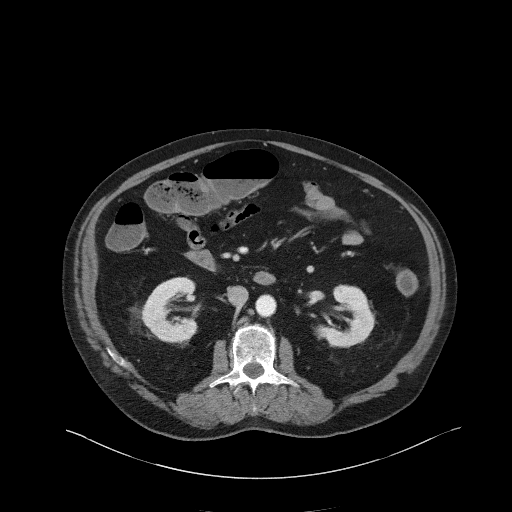
[im 69/109  bone]
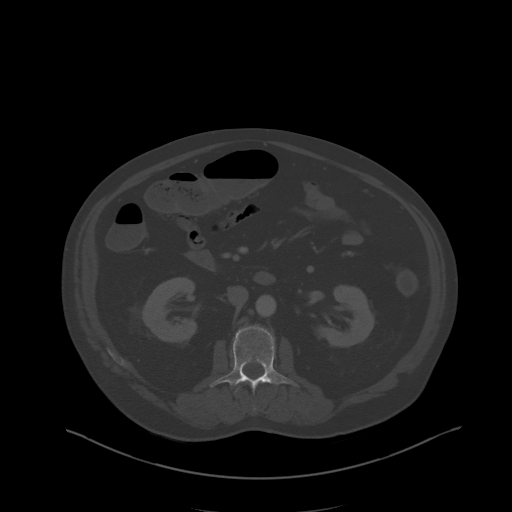
[im 80/109  soft-tissue]
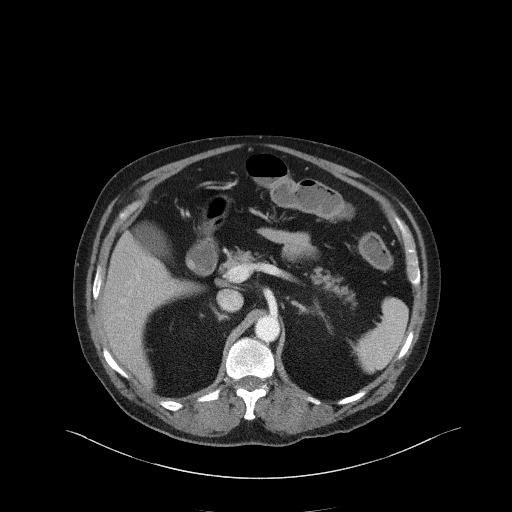
[im 86/109  soft-tissue]
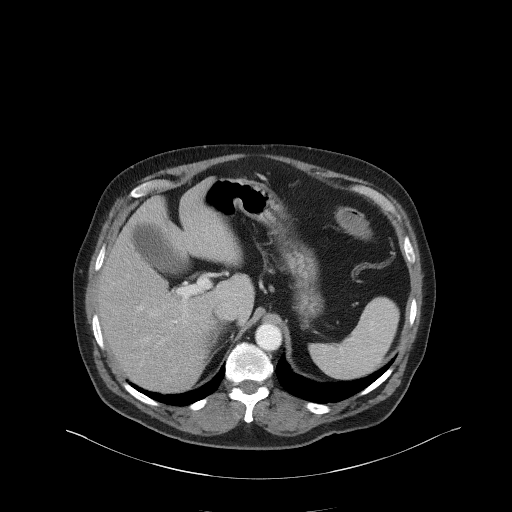
[im 91/109  soft-tissue]
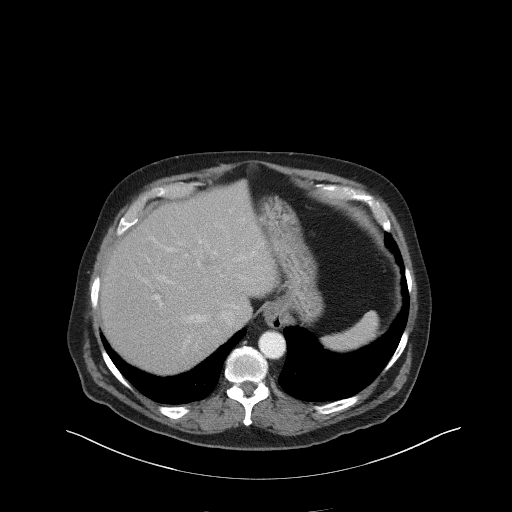
[im 103/109  soft-tissue]
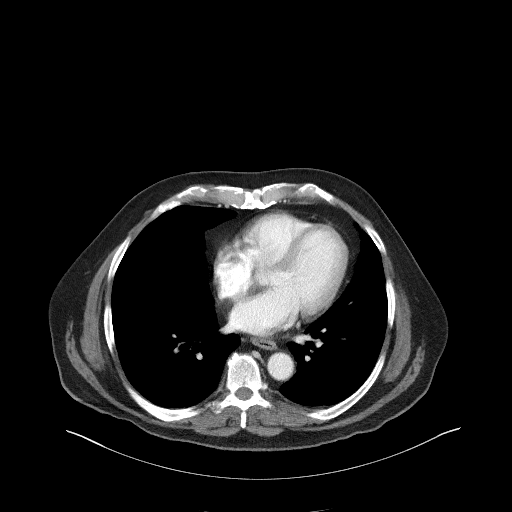

[Series 5: coronal st · coronal · 0.86mm/px · 3 of 102 slices shown]
[im 34/102  soft-tissue]
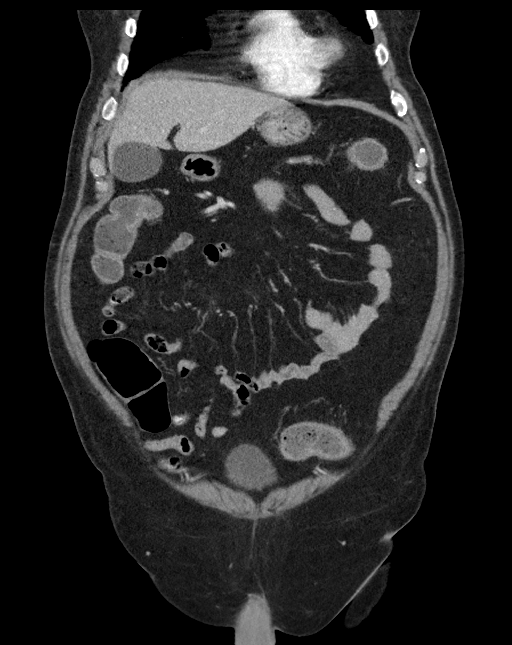
[im 45/102  soft-tissue]
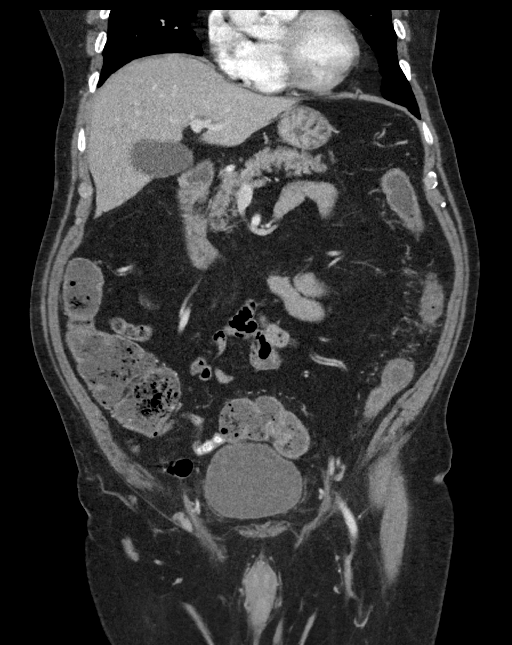
[im 57/102  soft-tissue]
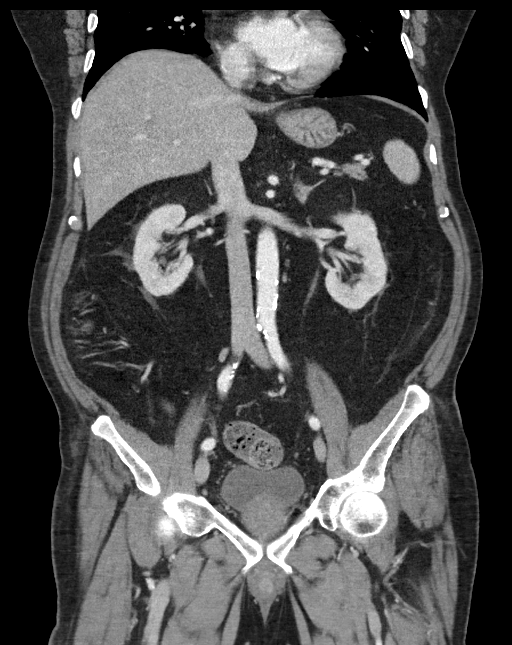

[16 of 46 positions shown; findings below may reference images not displayed]

FINDINGS: Lower chest: A stable 4 mm noncalcified lung nodule is seen within
the posterolateral aspect of the right lung base.

Hepatobiliary: No focal liver abnormality is seen. No gallstones,
gallbladder wall thickening, or biliary dilatation.

Pancreas: Unremarkable. No pancreatic ductal dilatation or
surrounding inflammatory changes.

Spleen: Normal in size without focal abnormality.

Adrenals/Urinary Tract: Adrenal glands are unremarkable. Kidneys are
normal, without renal calculi or hydronephrosis. A 1.5 cm simple
cyst is seen within the lateral aspect of the mid left kidney.
Bladder is unremarkable.

Stomach/Bowel: There is a small hiatal hernia. The appendix is not
identified. No evidence of bowel dilatation. Mild thickening of the
mid to distal descending colon is seen with a mild amount of
pericolonic inflammatory fat stranding.

Vascular/Lymphatic: There is moderate severity calcification of the
abdominal aorta and bilateral common iliac arteries. No enlarged
abdominal or pelvic lymph nodes.

Reproductive: There is mild to moderate severity prostate gland
enlargement.

Other: No abdominal wall hernia or abnormality. No abdominopelvic
ascites.

Musculoskeletal: Multilevel degenerative changes are seen throughout
the lumbar spine.
IMPRESSION: 1. Mild thickening of the mid to distal descending colon with a mild
amount of pericolonic inflammatory fat stranding, consistent with
mild colitis.
2. Small hiatal hernia.
3. Stable 4 mm noncalcified lung nodule within the posterolateral
aspect of the right lung base.
4. Small left renal cyst.
5. Mild to moderate severity prostate gland enlargement.
6. Aortic atherosclerosis.

Aortic Atherosclerosis ([id number]).
